# Patient Record
Sex: Male | Born: 1995 | Race: White | Hispanic: Yes | Marital: Single
Health system: Southern US, Community
[De-identification: ages and names within clinical notes are randomized; demographics above are authoritative.]

## PROBLEM LIST (undated history)

## (undated) HISTORY — PX: MULTIPLE TOOTH EXTRACTIONS: SHX2053

---
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
## 2015-05-08 ENCOUNTER — Encounter (HOSPITAL_COMMUNITY): Payer: Self-pay | Admitting: Emergency Medicine

## 2015-05-08 ENCOUNTER — Ambulatory Visit (HOSPITAL_COMMUNITY)
Admission: EM | Admit: 2015-05-08 | Discharge: 2015-05-08 | Disposition: A | Discharge status: Home / Self Care | Payer: Medicaid Other | Attending: Emergency Medicine | Admitting: Emergency Medicine

## 2015-05-08 ENCOUNTER — Ambulatory Visit (INDEPENDENT_AMBULATORY_CARE_PROVIDER_SITE_OTHER): Payer: Medicaid Other

## 2015-05-08 DIAGNOSIS — M549 Dorsalgia, unspecified: Secondary | ICD-10-CM

## 2015-05-08 DIAGNOSIS — R0789 Other chest pain: Secondary | ICD-10-CM

## 2015-05-08 MED ORDER — MELOXICAM 15 MG PO TABS: 15.0000 mg | ORAL_TABLET | Freq: Every day | ORAL | Status: DC

## 2015-05-08 MED ORDER — CYCLOBENZAPRINE HCL 10 MG PO TABS: 10.0000 mg | ORAL_TABLET | Freq: Three times a day (TID) | ORAL | Status: DC | PRN

## 2015-05-08 MED ORDER — PREDNISONE 50 MG PO TABS: ORAL_TABLET | ORAL | Status: DC

## 2015-05-08 NOTE — ED Notes (Signed)
[facility] with a complaint of back pain that has been ongoing for 1 month. The patient stated that over the last 3 days it has gotten worse and made it where he can not sleep and has trouble getting out of the bed.

## 2015-05-08 NOTE — Discharge Instructions (Signed)
X-ray is normal. Your pain is likely coming from the muscles in your back. Take prednisone daily for 5 days. Take meloxicam daily for 1 week, then as needed. Use the Flexeril 3 times a day as needed for muscle spasms. This medicine will make you drowsy. Follow-up with your regular doctor in 1-2 weeks.

## 2015-05-08 NOTE — ED Provider Notes (Signed)
CSN: [id number]     Arrival date & time 05/08/15  1555 History   First MD Initiated Contact with Patient 05/08/15 1752     No chief complaint on file.  (Consider location/radiation/quality/duration/timing/severity/associated sxs/prior Treatment) HPI He is a 20 year old man here for evaluation of back pain. He states this is been going on for about a month, but has worsened in the last 2-3 days. He states it is in the middle of his back. He does report some spasm sensations.  He denies anything specific that triggers the pain. He does work in [employer] and took 2 weeks off, but states it has not helped. He denies any radiating pain. He has tried ibuprofen without improvement. In the last several days he has also had pain across the anterior chest wall.  History reviewed. No pertinent past medical history. Past Surgical History  Procedure Laterality Date  . Multiple tooth extractions      premolars   History reviewed. No pertinent family history. Social History  Substance Use Topics  . Smoking status: Never Smoker   . Smokeless tobacco: None  . Alcohol Use: No    Review of Systems As in history of present illness Allergies  Review of patient's allergies indicates no known allergies.  Home Medications   Prior to Admission medications   Medication Sig Start Date End Date Taking? Authorizing Provider  ibuprofen (ADVIL,MOTRIN) 200 MG tablet Take 200 mg by mouth every 6 (six) hours as needed. For pain   Yes Historical Provider, MD  cetirizine (ZYRTEC) 10 MG tablet Take 10 mg by mouth daily.    Historical Provider, MD  cyclobenzaprine (FLEXERIL) 10 MG tablet Take 1 tablet (10 mg total) by mouth 3 (three) times daily as needed for muscle spasms. 05/08/15   Charm RingsErin J Honig, MD  fluticasone (FLONASE) 50 MCG/ACT nasal spray Place 1 spray into both nostrils 2 (two) times daily. 05/16/14   Linna HoffJames D Kindl, MD  meloxicam (MOBIC) 15 MG tablet Take 1 tablet (15 mg total) by mouth daily. For 1 week,  then as needed 05/08/15   Charm RingsErin J Honig, MD  predniSONE (DELTASONE) 50 MG tablet Take 1 pill daily for 5 days. 05/08/15   Charm RingsErin J Honig, MD   Meds Ordered and Administered this Visit  Medications - No data to display  BP 133/81 mmHg  Pulse 60  Temp(Src) 97.5 F (36.4 C) (Oral)  Resp 16  SpO2 100% No data found.   Physical Exam  Constitutional: He is oriented to person, place, and time. He appears well-nourished. No distress.  Cardiovascular: Normal rate.   Pulmonary/Chest: Effort normal. He exhibits tenderness (across anterior chest wall).  Musculoskeletal:  Back: No erythema or edema. No vertebral tenderness or step-offs. No point tenderness. He does have moderate spasm in the left thoracic paraspinous muscles. 5 out of 5 strength in bilateral upper extremities.  Neurological: He is alert and oriented to person, place, and time.    ED Course  Procedures (including critical care time)  Labs Review Labs Reviewed - No data to display  Imaging Review Dg Thoracic Spine 2 View  05/08/2015  CLINICAL DATA:  Upper back pain for 1 month.  No known injury. EXAM: THORACIC SPINE 2 VIEWS COMPARISON:  Chest x-ray dated 10/05/2014 FINDINGS: There is no evidence of thoracic spine fracture. Alignment is normal. No other significant bone abnormalities are identified. IMPRESSION: Negative. Electronically Signed   By: Francene BoyersJames  Maxwell M.D.   On: 05/08/2015 18:49     MDM  1. Mid back pain   2. Chest wall pain    X-ray negative. Suspect musculoskeletal pain. Treat with prednisone, Flexeril, meloxicam. Follow up with PCP in 1-2 weeks.    Charm Rings, MD 05/08/15 [phone]

## 2015-08-31 ENCOUNTER — Ambulatory Visit (HOSPITAL_COMMUNITY)
Admission: EM | Admit: 2015-08-31 | Discharge: 2015-08-31 | Disposition: A | Discharge status: Home / Self Care | Payer: Medicaid Other | Attending: Emergency Medicine | Admitting: Emergency Medicine

## 2015-08-31 ENCOUNTER — Encounter (HOSPITAL_COMMUNITY): Payer: Self-pay | Admitting: Emergency Medicine

## 2015-08-31 DIAGNOSIS — S61219A Laceration without foreign body of unspecified finger without damage to nail, initial encounter: Secondary | ICD-10-CM | POA: Diagnosis not present

## 2015-08-31 MED ORDER — LIDOCAINE HCL (PF) 1 % IJ SOLN
INTRAMUSCULAR | Status: AC
  Filled 2015-08-31: qty 30

## 2015-08-31 MED ORDER — NAPROXEN 500 MG PO TABS: 500.0000 mg | ORAL_TABLET | Freq: Two times a day (BID) | ORAL | 0 refills | Status: DC

## 2015-08-31 NOTE — ED Provider Notes (Signed)
CSN: [id number]     Arrival date & time 08/31/15  1424 History   First MD Initiated Contact with Patient 08/31/15 1456     Chief Complaint  Patient presents with  . Extremity Laceration   (Consider location/radiation/quality/duration/timing/severity/associated sxs/prior Treatment) HPI  Patient reports that he was repairing sheet rock in his room around 11am and sustained a cut to his left pointer finger from a box cutter.  He reports limited ROM secondary to pain and swelling.  He denies numbness or tingling.  He reports that he took Motrin 200mg  for pain and bandaged it.  Motrin has not helped pain much.  Last Tetanus 2015.  History reviewed. No pertinent past medical history. Past Surgical History:  Procedure Laterality Date  . MULTIPLE TOOTH EXTRACTIONS     premolars   No family history on file. Social History  Substance Use Topics  . Smoking status: Never Smoker  . Smokeless tobacco: Never Used  . Alcohol use No    Review of Systems  Skin: Positive for wound.  Neurological: Negative for weakness and numbness.    Allergies  Review of patient's allergies indicates no known allergies.  Home Medications   Prior to Admission medications   Medication Sig Start Date End Date Taking? Authorizing Provider  cetirizine (ZYRTEC) 10 MG tablet Take 10 mg by mouth daily.    Historical Provider, MD  cyclobenzaprine (FLEXERIL) 10 MG tablet Take 1 tablet (10 mg total) by mouth 3 (three) times daily as needed for muscle spasms. 05/08/15   Charm Rings, MD  fluticasone (FLONASE) 50 MCG/ACT nasal spray Place 1 spray into both nostrils 2 (two) times daily. 05/16/14   [name], MD  naproxen (NAPROSYN) 500 MG tablet Take 1 tablet (500 mg total) by mouth 2 (two) times daily with a meal. As needed for pain and swelling. 08/31/15   [name], DO   Meds Ordered and Administered this Visit  Medications - No data to display  BP 133/62 (BP Location: Left Arm)   Pulse 64   Temp 98.1 F  (36.7 C) (Oral)   Resp 16   SpO2 100%  No data found.   Physical Exam  Constitutional: He appears well-developed. No distress.  Musculoskeletal:       Hands: Dorsal aspect of left index finger superficial to the PIP with ~ 1.5cm laceration, actively bleeding.  No tendon appreciated on visual exam.  Left thumb with a superficial laceration that is hemostatic. +full extension of fingers, limited flexion of left index finger secondary to pain and swelling.  Able to flex about 25 degrees.    Neurological:  Light touch sensation to all fingers present.  Pressure sensation and pain sensation also in tact.    Urgent Care Course   Clinical Course    .Marland KitchenLaceration Repair Date/Time: 08/31/2015 4:03 PM Performed by: [name] Authorized by: Charm Rings   Consent:    Consent obtained:  Verbal   Consent given by:  Patient   Risks discussed:  Infection, pain, tendon damage, nerve damage, need for additional repair and vascular damage   Alternatives discussed:  No treatment and referral Anesthesia (see MAR for exact dosages):    Anesthesia method:  Nerve block   Block location:  Metacarpal block of the second digit   Block needle gauge:  25 G   Block anesthetic:  Lidocaine 1% w/o epi   Block injection procedure:  Anatomic landmarks palpated, negative aspiration for blood and introduced needle  Block outcome:  Anesthesia achieved Laceration details:    Location:  Finger   Finger location:  L index finger Repair type:    Repair type:  Simple Pre-procedure details:    Preparation:  Patient was prepped and draped in usual sterile fashion Exploration:    Hemostasis achieved with:  Direct pressure   Wound exploration: wound explored through full range of motion   Treatment:    Area cleansed with:  Betadine and soap and water   Amount of cleaning:  Standard   Irrigation method:  Pressure wash   Visualized foreign bodies/material removed: no   Skin repair:    Repair method:   Sutures   Suture size:  4-0   Suture material:  Prolene   Suture technique:  Simple interrupted   Number of sutures:  3 Approximation:    Approximation:  Close   Vermilion border: well-aligned   Post-procedure details:    Patient tolerance of procedure:  Tolerated well, no immediate complications   (including critical care time)  Labs Review Labs Reviewed - No data to display  Imaging Review No results found.   MDM   1. Laceration of finger, initial encounter     Keith Brooks is a 20 y.o. male that presents to [facility] for laceration of left index finger.  Laceration was explored and irrigated.  No tendon involvement apparent.  Patient able to fully extend finger.  Laceration repaired with 3 simple interrupted sutures.  Naproxen rx'd.  Discussed return precautions.  Patient to follow up in 1 week for suture removal.  Meds ordered this encounter  Medications  . naproxen (NAPROSYN) 500 MG tablet    Sig: Take 1 tablet (500 mg total) by mouth 2 (two) times daily with a meal. As needed for pain and swelling.    Dispense:  14 tablet    Refill:  0     [name], DO PGY-3, [facility]        [name], DO 08/31/15 1621    [name], DO 08/31/15 1621

## 2015-08-31 NOTE — ED Triage Notes (Signed)
PT cut his left thumb and index finger with a blade at 11am. No active bleeding.

## 2016-03-07 ENCOUNTER — Ambulatory Visit (HOSPITAL_COMMUNITY)
Admission: EM | Admit: 2016-03-07 | Discharge: 2016-03-07 | Disposition: A | Discharge status: Home / Self Care | Payer: Medicaid Other | Attending: Emergency Medicine | Admitting: Emergency Medicine

## 2016-03-07 ENCOUNTER — Encounter (HOSPITAL_COMMUNITY): Payer: Self-pay | Admitting: Emergency Medicine

## 2016-03-07 DIAGNOSIS — H1030 Unspecified acute conjunctivitis, unspecified eye: Secondary | ICD-10-CM

## 2016-03-07 MED ORDER — POLYMYXIN B-TRIMETHOPRIM 10000-0.1 UNIT/ML-% OP SOLN: 2.0000 [drp] | OPHTHALMIC | 0 refills | Status: DC

## 2016-03-07 NOTE — ED Provider Notes (Signed)
CSN: [id number]     Arrival date & time 03/07/16  1555 History   First MD Initiated Contact with Patient 03/07/16 1654     Chief Complaint  Patient presents with  . Conjunctivitis   (Consider location/radiation/quality/duration/timing/severity/associated sxs/prior Treatment) Patient c/o redness and discharge bilateral eyes.   The history is provided by the patient.  Conjunctivitis  This is a new problem. The current episode started 6 to 12 hours ago. The problem occurs constantly. The problem has not changed since onset.Nothing aggravates the symptoms. Nothing relieves the symptoms. He has tried nothing for the symptoms.    History reviewed. No pertinent past medical history. Past Surgical History:  Procedure Laterality Date  . MULTIPLE TOOTH EXTRACTIONS     premolars   History reviewed. No pertinent family history. Social History  Substance Use Topics  . Smoking status: Current Every Day Smoker    Packs/day: 0.10  . Smokeless tobacco: Never Used  . Alcohol use No    Review of Systems  Constitutional: Negative.   HENT: Negative.   Eyes: Positive for redness.  Respiratory: Negative.   Cardiovascular: Negative.   Gastrointestinal: Negative.   Endocrine: Negative.   Genitourinary: Negative.   Musculoskeletal: Negative.   Allergic/Immunologic: Negative.   Neurological: Negative.   Hematological: Negative.   Psychiatric/Behavioral: Negative.     Allergies  Patient has no known allergies.  Home Medications   Prior to Admission medications   Medication Sig Start Date End Date Taking? Authorizing Provider  cetirizine (ZYRTEC) 10 MG tablet Take 10 mg by mouth daily.    Historical Provider, MD  cyclobenzaprine (FLEXERIL) 10 MG tablet Take 1 tablet (10 mg total) by mouth 3 (three) times daily as needed for muscle spasms. 05/08/15   Charm RingsErin J Honig, MD  fluticasone (FLONASE) 50 MCG/ACT nasal spray Place 1 spray into both nostrils 2 (two) times daily. 05/16/14   Linna HoffJames D Kindl, MD   naproxen (NAPROSYN) 500 MG tablet Take 1 tablet (500 mg total) by mouth 2 (two) times daily with a meal. As needed for pain and swelling. 08/31/15   Raliegh IpAshly M Gottschalk, DO  trimethoprim-polymyxin b (POLYTRIM) ophthalmic solution Place 2 drops into both eyes every 4 (four) hours. 03/07/16   Deatra CanterWilliam J Dakayla Disanti, FNP   Meds Ordered and Administered this Visit  Medications - No data to display  BP 117/70 (BP Location: Right Arm)   Pulse 61   Temp 98.2 F (36.8 C) (Oral)   SpO2 100%  No data found.   Physical Exam  Constitutional: He appears well-developed and well-nourished.  HENT:  Head: Normocephalic and atraumatic.  Right Ear: External ear normal.  Left Ear: External ear normal.  Mouth/Throat: Oropharynx is clear and moist.  Eyes: EOM are normal. Pupils are equal, round, and reactive to light.  Bilateral conjunctivitis with erythema and edema conjunctiva and sclera erythema.  Neck: Normal range of motion. Neck supple.  Cardiovascular: Normal rate, regular rhythm and normal heart sounds.   Pulmonary/Chest: Effort normal and breath sounds normal.  Nursing note and vitals reviewed.   Urgent Care Course     Procedures (including critical care time)  Labs Review Labs Reviewed - No data to display  Imaging Review No results found.   Visual Acuity Review  Right Eye Distance:   Left Eye Distance:   Bilateral Distance:    Right Eye Near:   Left Eye Near:    Bilateral Near:         MDM   1. Acute conjunctivitis, unspecified  acute conjunctivitis type, unspecified laterality    Polytrim eye gtt's      Deatra Canter, FNP 03/07/16 1730

## 2016-03-07 NOTE — ED Triage Notes (Signed)
Pt woke up this morning with both eyes stuck shut.  Pt reports some nasal congestion and a slight headache as well.

## 2017-07-04 ENCOUNTER — Ambulatory Visit (HOSPITAL_COMMUNITY)
Admission: EM | Admit: 2017-07-04 | Discharge: 2017-07-04 | Disposition: A | Discharge status: Home / Self Care | Payer: Self-pay | Attending: Family Medicine | Admitting: Family Medicine

## 2017-07-04 ENCOUNTER — Encounter (HOSPITAL_COMMUNITY): Payer: Self-pay | Admitting: Family Medicine

## 2017-07-04 ENCOUNTER — Ambulatory Visit (INDEPENDENT_AMBULATORY_CARE_PROVIDER_SITE_OTHER): Payer: Self-pay

## 2017-07-04 DIAGNOSIS — W540XXD Bitten by dog, subsequent encounter: Secondary | ICD-10-CM

## 2017-07-04 DIAGNOSIS — S91352D Open bite, left foot, subsequent encounter: Secondary | ICD-10-CM

## 2017-07-04 DIAGNOSIS — S9032XA Contusion of left foot, initial encounter: Secondary | ICD-10-CM

## 2017-07-04 MED ORDER — NAPROXEN 500 MG PO TABS: 500.0000 mg | ORAL_TABLET | Freq: Two times a day (BID) | ORAL | 0 refills | Status: AC

## 2017-07-04 NOTE — ED Provider Notes (Signed)
[facility]   [id number] 07/04/17 Arrival Time: 1123  SUBJECTIVE: History from: patient. Keith GuttingJose G Brooks is a 22 y.o. male complains of worsening left foot pain that began 6 days ago.  States he was attacked by a pitbull.  Seen at "clinic" and prescribed ibuprofen and antibiotics, also received pain shot in office.  X-rays were not obtained.  States symptoms have not improved.  Localizes the pain to the left foot.  Describes the pain as constant and sharp, aching, and throbbing in character.  Has tried ibuprofen 800 mg without relief.  Symptoms are made worse with weight-bearing.  Denies similar symptoms in the past.  Complains of erythema, effusion, and ecchymosis.  Denies fever, chills, nausea, vomiting, weakness, numbness and tingling.      ROS: As per HPI.  Tetanus up-to-date  History reviewed. No pertinent past medical history. Past Surgical History:  Procedure Laterality Date  . MULTIPLE TOOTH EXTRACTIONS     premolars   No Known Allergies No current facility-administered medications on file prior to encounter.    Current Outpatient Medications on File Prior to Encounter  Medication Sig Dispense Refill  . cetirizine (ZYRTEC) 10 MG tablet Take 10 mg by mouth daily.     Social History   Socioeconomic History  . Marital status: Single    Spouse name: Not on file  . Number of children: Not on file  . Years of education: Not on file  . Highest education level: Not on file  Occupational History  . Not on file  Social Needs  . Financial resource strain: Not on file  . Food insecurity:    Worry: Not on file    Inability: Not on file  . Transportation needs:    Medical: Not on file    Non-medical: Not on file  Tobacco Use  . Smoking status: Current Every Day Smoker    Packs/day: 0.10  . Smokeless tobacco: Never Used  Substance and Sexual Activity  . Alcohol use: No  . Drug use: Yes    Frequency: 4.0 times per week    Types: Marijuana  . Sexual activity: Never    Lifestyle  . Physical activity:    Days per week: Not on file    Minutes per session: Not on file  . Stress: Not on file  Relationships  . Social connections:    Talks on phone: Not on file    Gets together: Not on file    Attends religious service: Not on file    Active member of club or organization: Not on file    Attends meetings of clubs or organizations: Not on file    Relationship status: Not on file  . Intimate partner violence:    Fear of current or ex partner: Not on file    Emotionally abused: Not on file    Physically abused: Not on file    Forced sexual activity: Not on file  Other Topics Concern  . Not on file  Social History Narrative  . Not on file   History reviewed. No pertinent family history.  OBJECTIVE:  Vitals:   07/04/17 1136  BP: 123/81  Pulse: 82  Resp: 18  Temp: (!) 97.5 F (36.4 C)  SpO2: 98%    General appearance: AOx3; in no acute distress.  Head: NCAT Lungs: CTA bilaterally Heart: RRR.  Clear S1 and S2 without murmur, gallops, or rubs.  Radial pulses 2+ bilaterally. Musculoskeletal: Left foot Inspection: Three healing bite wounds on lateral and  medial aspect of foot  with mild surrounding erythema.  Mild ecchymosis dorsal aspect of 2nd  and 3rd toes.  No active drainage Palpation: Diffusely tender to palpation about the foot, specifically over  medial and anterior foot ROM: LROM due to discomfort; patient wiggles toes CV: Dorsalis pedis and posterior tibialis pulse 2+ and intact; cap refill <2  secs Skin: warm and dry Neurologic: antalgic gait; Sensation intact about the lower extremities Psychological: alert and cooperative; normal mood and affect        DIAGNOSTIC STUDIES: CLINICAL DATA: Dog bite 1 week prior  EXAM: LEFT FOOT - COMPLETE 3+ VIEW  COMPARISON: None.  FINDINGS: Soft tissue swelling in the left mid foot. No fracture or dislocation. No cortical erosions or periosteal reaction. No appreciable soft tissue  gas. No significant arthropathy. No suspicious focal osseous lesions. No radiopaque foreign body.  IMPRESSION: Soft tissue swelling in the left mid foot. No fracture, malalignment or specific radiographic findings of osteomyelitis.   Electronically Signed By: Delbert Phenix M.D. On: 07/04/2017 12:13  X-rays negative for bony abnormalities including fracture, or dislocation.   I have reviewed the x-rays and the radiologist interpretation. I am in agreement with the radiologist interpretation.    ASSESSMENT & PLAN:  1. Dog bite of left foot, subsequent encounter   2. Contusion of left foot, initial encounter     Meds ordered this encounter  Medications  . naproxen (NAPROSYN) 500 MG tablet    Sig: Take 1 tablet (500 mg total) by mouth 2 (two) times daily.    Dispense:  30 tablet    Refill:  0    Order Specific Question:   Supervising Provider    Answer:   Isa Rankin [[id number]]   X-rays were negative for fracture, retained foreign body, or signs of bone infection Continue conservative management of rest, ice, and elevate Continue to take antibiotic daily and as prescribed to completion Discontinue ibuprofen 800 mg.  Take naproxen as needed for pain relief (may cause abdominal discomfort, ulcers, and GI bleeds avoid taking with other NSAIDs) Follow up with PCP if next week for reevaluation and managment Return or go to the ER if you have any new or worsening symptoms (fever, chills, chest pain, abdominal pain, changes in bowel or bladder habits, pain radiating into lower legs, etc...)   Patient declines crutches and blood work due to self-pay status.  Reviewed expectations re: course of current medical issues. Questions answered. Outlined signs and symptoms indicating need for more acute intervention. Patient verbalized understanding. After Visit Summary given.    Rennis Harding, PA-C 07/04/17 1245

## 2017-07-04 NOTE — Discharge Instructions (Signed)
X-rays were negative for fracture, retained foreign body, or signs of bone infection Continue conservative management of rest, ice, and elevate Continue to take antibiotic daily and as prescribed to completion Discontinue ibuprofen 800 mg.  Take naproxen as needed for pain relief (may cause abdominal discomfort, ulcers, and GI bleeds avoid taking with other NSAIDs) Follow up with PCP if next week for reevaluation and managment Return or go to the ER if you have any new or worsening symptoms (fever, chills, chest pain, abdominal pain, changes in bowel or bladder habits, pain radiating into lower legs, etc...)

## 2017-07-04 NOTE — ED Triage Notes (Signed)
Pt here for dog bite to the left foot. He was seen a week ago when this happened. He was prescribed ibuprofen and abx. sts the pain is worse.

## 2017-07-30 ENCOUNTER — Ambulatory Visit (HOSPITAL_COMMUNITY)
Admission: EM | Admit: 2017-07-30 | Discharge: 2017-07-30 | Disposition: A | Discharge status: Home / Self Care | Payer: Self-pay | Attending: Family Medicine | Admitting: Family Medicine

## 2017-07-30 ENCOUNTER — Encounter (HOSPITAL_COMMUNITY): Payer: Self-pay | Admitting: Emergency Medicine

## 2017-07-30 DIAGNOSIS — L0291 Cutaneous abscess, unspecified: Secondary | ICD-10-CM

## 2017-07-30 MED ORDER — DOXYCYCLINE HYCLATE 100 MG PO CAPS: 100.0000 mg | ORAL_CAPSULE | Freq: Two times a day (BID) | ORAL | 0 refills | Status: AC

## 2017-07-30 NOTE — ED Provider Notes (Signed)
[facility]   [id number] 07/30/17 Arrival Time: 1024   SUBJECTIVE:  Rosanne GuttingJose G Allman is a 22 y.o. male who presents with a possible abscess of his right buttocks. Began approximately 6 months ago, but has gotten worse.  Has tried "popping" without improvement in his symptoms.  Denies aggravating or alleviating symptoms.  No past hx of similar symptoms.  Complains of associated erythema and swelling.  Denies fever, chills, nausea, vomiting, or changes in bowel or bladder habits.    ROS: As per HPI.  History reviewed. No pertinent past medical history. Past Surgical History:  Procedure Laterality Date  . MULTIPLE TOOTH EXTRACTIONS     premolars   No Known Allergies No current facility-administered medications on file prior to encounter.    Current Outpatient Medications on File Prior to Encounter  Medication Sig Dispense Refill  . cetirizine (ZYRTEC) 10 MG tablet Take 10 mg by mouth daily.    . naproxen (NAPROSYN) 500 MG tablet Take 1 tablet (500 mg total) by mouth 2 (two) times daily. 30 tablet 0   Social History   Socioeconomic History  . Marital status: Single    Spouse name: Not on file  . Number of children: Not on file  . Years of education: Not on file  . Highest education level: Not on file  Occupational History  . Not on file  Social Needs  . Financial resource strain: Not on file  . Food insecurity:    Worry: Not on file    Inability: Not on file  . Transportation needs:    Medical: Not on file    Non-medical: Not on file  Tobacco Use  . Smoking status: Current Every Day Smoker    Packs/day: 0.10  . Smokeless tobacco: Never Used  Substance and Sexual Activity  . Alcohol use: No  . Drug use: Yes    Frequency: 4.0 times per week    Types: Marijuana  . Sexual activity: Never  Lifestyle  . Physical activity:    Days per week: Not on file    Minutes per session: Not on file  . Stress: Not on file  Relationships  . Social connections:    Talks on  phone: Not on file    Gets together: Not on file    Attends religious service: Not on file    Active member of club or organization: Not on file    Attends meetings of clubs or organizations: Not on file    Relationship status: Not on file  . Intimate partner violence:    Fear of current or ex partner: Not on file    Emotionally abused: Not on file    Physically abused: Not on file    Forced sexual activity: Not on file  Other Topics Concern  . Not on file  Social History Narrative  . Not on file   History reviewed. No pertinent family history.  OBJECTIVE:  Vitals:   07/30/17 1101  BP: 119/76  Pulse: (!) 56  Resp: 16  Temp: 97.7 F (36.5 C)  TempSrc: Oral  SpO2: 100%     General appearance: alert; no distress Lungs: CTA bilaterally CV: RRR without gallop, rub, or murmur; bilateral 2+ radial pulses Skin: 4 x 2 cm area of induration and erythema of his right buttocks without area of fluctuance; tender to touch; no active drainage Psychological: alert and cooperative; normal mood and affect  ASSESSMENT & PLAN:  1. Abscess     Meds ordered this encounter  Medications  . doxycycline (VIBRAMYCIN) 100 MG capsule    Sig: Take 1 capsule (100 mg total) by mouth 2 (two) times daily.    Dispense:  20 capsule    Refill:  0    Order Specific Question:   Supervising Provider    Answer:   Isa Rankin [[id number]]   Apply warm compresses 3-4x daily for 10-15 minutes Wash site daily with warm water and mild soap Keep covered to avoid friction Take antibiotic as prescribed and to completion Follow up here or with PCP if symptoms persists Return or go to the ED if you have any new or worsening symptoms  Reviewed expectations re: course of current medical issues. Questions answered. Outlined signs and symptoms indicating need for more acute intervention. Patient verbalized understanding. After Visit Summary given.          Rennis Harding, PA-C 07/30/17 1151

## 2017-07-30 NOTE — ED Triage Notes (Signed)
Pt sts possible abscess to right buttocks x months getting larger per pt

## 2020-10-02 ENCOUNTER — Emergency Department (HOSPITAL_COMMUNITY): Payer: Self-pay

## 2020-10-02 ENCOUNTER — Emergency Department (HOSPITAL_COMMUNITY)
Admission: EM | Admit: 2020-10-02 | Discharge: 2020-10-02 | Discharge status: Against Medical Advice | Payer: Self-pay | Attending: Emergency Medicine | Admitting: Emergency Medicine

## 2020-10-02 ENCOUNTER — Encounter (HOSPITAL_COMMUNITY): Payer: Self-pay | Admitting: Emergency Medicine

## 2020-10-02 DIAGNOSIS — Z20822 Contact with and (suspected) exposure to covid-19: Secondary | ICD-10-CM | POA: Insufficient documentation

## 2020-10-02 DIAGNOSIS — S61401A Unspecified open wound of right hand, initial encounter: Secondary | ICD-10-CM | POA: Insufficient documentation

## 2020-10-02 DIAGNOSIS — W540XXA Bitten by dog, initial encounter: Secondary | ICD-10-CM | POA: Insufficient documentation

## 2020-10-02 DIAGNOSIS — R4182 Altered mental status, unspecified: Secondary | ICD-10-CM | POA: Insufficient documentation

## 2020-10-02 DIAGNOSIS — Z23 Encounter for immunization: Secondary | ICD-10-CM | POA: Insufficient documentation

## 2020-10-02 DIAGNOSIS — R000 Tachycardia, unspecified: Secondary | ICD-10-CM | POA: Insufficient documentation

## 2020-10-02 DIAGNOSIS — Y900 Blood alcohol level of less than 20 mg/100 ml: Secondary | ICD-10-CM | POA: Insufficient documentation

## 2020-10-02 DIAGNOSIS — R609 Edema, unspecified: Secondary | ICD-10-CM

## 2020-10-02 DIAGNOSIS — T148XXA Other injury of unspecified body region, initial encounter: Secondary | ICD-10-CM

## 2020-10-02 LAB — URINALYSIS, ROUTINE W REFLEX MICROSCOPIC
Bilirubin Urine: NEGATIVE
Glucose, UA: NEGATIVE mg/dL
Ketones, ur: 20 mg/dL — AB
Leukocytes,Ua: NEGATIVE
Nitrite: NEGATIVE
Protein, ur: NEGATIVE mg/dL
Specific Gravity, Urine: 1.024 (ref 1.005–1.030)
pH: 5 (ref 5.0–8.0)

## 2020-10-02 LAB — CBC WITH DIFFERENTIAL/PLATELET
Abs Immature Granulocytes: 0.19 10*3/uL — ABNORMAL HIGH (ref 0.00–0.07)
Basophils Absolute: 0.1 10*3/uL (ref 0.0–0.1)
Basophils Relative: 0 %
Eosinophils Absolute: 0 10*3/uL (ref 0.0–0.5)
Eosinophils Relative: 0 %
HCT: 39.1 % (ref 39.0–52.0)
Hemoglobin: 13.1 g/dL (ref 13.0–17.0)
Immature Granulocytes: 1 %
Lymphocytes Relative: 5 %
Lymphs Abs: 1.2 10*3/uL (ref 0.7–4.0)
MCH: 29.2 pg (ref 26.0–34.0)
MCHC: 33.5 g/dL (ref 30.0–36.0)
MCV: 87.1 fL (ref 80.0–100.0)
Monocytes Absolute: 2 10*3/uL — ABNORMAL HIGH (ref 0.1–1.0)
Monocytes Relative: 8 %
Neutro Abs: 21.8 10*3/uL — ABNORMAL HIGH (ref 1.7–7.7)
Neutrophils Relative %: 86 %
Platelets: 242 10*3/uL (ref 150–400)
RBC: 4.49 MIL/uL (ref 4.22–5.81)
RDW: 14 % (ref 11.5–15.5)
WBC: 25.2 10*3/uL — ABNORMAL HIGH (ref 4.0–10.5)
nRBC: 0 % (ref 0.0–0.2)

## 2020-10-02 LAB — URINALYSIS, COMPLETE (UACMP) WITH MICROSCOPIC
Bilirubin Urine: NEGATIVE
Glucose, UA: NEGATIVE mg/dL
Ketones, ur: 20 mg/dL — AB
Leukocytes,Ua: NEGATIVE
Nitrite: NEGATIVE
Protein, ur: NEGATIVE mg/dL
Specific Gravity, Urine: 1.024 (ref 1.005–1.030)
pH: 5 (ref 5.0–8.0)

## 2020-10-02 LAB — RAPID URINE DRUG SCREEN, HOSP PERFORMED
Amphetamines: POSITIVE — AB
Barbiturates: NOT DETECTED
Benzodiazepines: POSITIVE — AB
Cocaine: NOT DETECTED
Opiates: NOT DETECTED
Tetrahydrocannabinol: NOT DETECTED

## 2020-10-02 LAB — COMPREHENSIVE METABOLIC PANEL
ALT: 19 U/L (ref 0–44)
AST: 40 U/L (ref 15–41)
Albumin: 4.3 g/dL (ref 3.5–5.0)
Alkaline Phosphatase: 63 U/L (ref 38–126)
Anion gap: 15 (ref 5–15)
BUN: 18 mg/dL (ref 6–20)
CO2: 21 mmol/L — ABNORMAL LOW (ref 22–32)
Calcium: 9.2 mg/dL (ref 8.9–10.3)
Chloride: 103 mmol/L (ref 98–111)
Creatinine, Ser: 1.57 mg/dL — ABNORMAL HIGH (ref 0.61–1.24)
GFR, Estimated: >60 mL/min (ref >60)
Glucose, Bld: 60 mg/dL — ABNORMAL LOW (ref 70–99)
Potassium: 3.8 mmol/L (ref 3.5–5.1)
Sodium: 139 mmol/L (ref 135–145)
Total Bilirubin: 1.3 mg/dL — ABNORMAL HIGH (ref 0.3–1.2)
Total Protein: 6.9 g/dL (ref 6.5–8.1)

## 2020-10-02 LAB — CBG MONITORING, ED
Glucose-Capillary: 76 mg/dL (ref 70–99)
Glucose-Capillary: 85 mg/dL (ref 70–99)
Glucose-Capillary: 117 mg/dL — ABNORMAL HIGH (ref 70–99)

## 2020-10-02 LAB — I-STAT CHEM 8, ED
BUN: 21 mg/dL — ABNORMAL HIGH (ref 6–20)
Calcium, Ion: 1.06 mmol/L — ABNORMAL LOW (ref 1.15–1.40)
Chloride: 108 mmol/L (ref 98–111)
Creatinine, Ser: 1.5 mg/dL — ABNORMAL HIGH (ref 0.61–1.24)
Glucose, Bld: 59 mg/dL — ABNORMAL LOW (ref 70–99)
HCT: 39 % (ref 39.0–52.0)
Hemoglobin: 13.3 g/dL (ref 13.0–17.0)
Potassium: 3.7 mmol/L (ref 3.5–5.1)
Sodium: 139 mmol/L (ref 135–145)
TCO2: 24 mmol/L (ref 22–32)

## 2020-10-02 LAB — CK: Total CK: 915 U/L — ABNORMAL HIGH (ref 49–397)

## 2020-10-02 LAB — RESP PANEL BY RT-PCR (FLU A&B, COVID) ARPGX2
Influenza A by PCR: NEGATIVE
Influenza B by PCR: NEGATIVE
SARS Coronavirus 2 by RT PCR: NEGATIVE

## 2020-10-02 LAB — ACETAMINOPHEN LEVEL: Acetaminophen (Tylenol), Serum: <10 ug/mL — ABNORMAL LOW (ref 10–30)

## 2020-10-02 LAB — SALICYLATE LEVEL: Salicylate Lvl: <7 mg/dL — ABNORMAL LOW (ref 7.0–30.0)

## 2020-10-02 LAB — LACTIC ACID, PLASMA: Lactic Acid, Venous: 1.3 mmol/L (ref 0.5–1.9)

## 2020-10-02 LAB — ETHANOL: Alcohol, Ethyl (B): <10 mg/dL (ref <10)

## 2020-10-02 MED ORDER — SODIUM CHLORIDE 0.9 % IV BOLUS
1000.0000 mL | Freq: Once | INTRAVENOUS | Status: AC
  Administered 2020-10-02: 1000 mL via INTRAVENOUS

## 2020-10-02 MED ORDER — ONDANSETRON HCL 4 MG/2ML IJ SOLN
4.0000 mg | Freq: Once | INTRAMUSCULAR | Status: AC
  Administered 2020-10-02: 4 mg via INTRAVENOUS

## 2020-10-02 MED ORDER — AMOXICILLIN-POT CLAVULANATE 875-125 MG PO TABS: 1.0000 | ORAL_TABLET | Freq: Two times a day (BID) | ORAL | 0 refills | Status: AC

## 2020-10-02 MED ORDER — DEXTROSE-NACL 5-0.9 % IV SOLN: Freq: Once | INTRAVENOUS | Status: AC

## 2020-10-02 MED ORDER — MUPIROCIN CALCIUM 2 % EX CREA: 1.0000 "application " | TOPICAL_CREAM | Freq: Two times a day (BID) | CUTANEOUS | 0 refills | Status: AC

## 2020-10-02 MED ORDER — SODIUM CHLORIDE 0.9 % IV BOLUS
500.0000 mL | Freq: Once | INTRAVENOUS | Status: AC
  Administered 2020-10-02: 500 mL via INTRAVENOUS

## 2020-10-02 MED ORDER — IOHEXOL 350 MG/ML SOLN
60.0000 mL | Freq: Once | INTRAVENOUS | Status: AC | PRN
  Administered 2020-10-02: 60 mL via INTRAVENOUS

## 2020-10-02 MED ORDER — TETANUS-DIPHTH-ACELL PERTUSSIS 5-2.5-18.5 LF-MCG/0.5 IM SUSY
0.5000 mL | PREFILLED_SYRINGE | Freq: Once | INTRAMUSCULAR | Status: AC
  Administered 2020-10-02: 0.5 mL via INTRAMUSCULAR
  Filled 2020-10-02: qty 0.5

## 2020-10-02 MED ORDER — AMOXICILLIN-POT CLAVULANATE 875-125 MG PO TABS: 1.0000 | ORAL_TABLET | Freq: Two times a day (BID) | ORAL | 0 refills | Status: DC

## 2020-10-02 MED ORDER — SODIUM CHLORIDE 0.9 % IV SOLN
3.0000 g | Freq: Once | INTRAVENOUS | Status: AC
  Administered 2020-10-02: 3 g via INTRAVENOUS
  Filled 2020-10-02: qty 8

## 2020-10-02 NOTE — Discharge Instructions (Addendum)
I am very concerned that you have undiagnosed medical problems causing severe blood test abnormalities.  Your blood sugar continues to drop.  Return back to the ER at anytime if you change your mind.  Drink and eat a lot of sugary foods at home, follow-up with your doctor in 1 or 2 days.   Regarding your hand wound: Do not submerge wounds in any body of water:  no pools, hot tubs, oceans, lakes, tubs, streams.  Follow up with hand surgery as an outpatient.  Take all antibiotics

## 2020-10-02 NOTE — ED Notes (Addendum)
Pt alert and oriented x 4 at this time. Pt requesting to leave and is refusing to stay. Dr. Audley Hose notified.

## 2020-10-02 NOTE — ED Notes (Signed)
Pt awake and significantly more oriented than on arrival. Hand wound unwrapped and placed in iodine and sterile water to soak.
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
## 2020-10-02 NOTE — ED Provider Notes (Addendum)
Patient signed out to me as pending recovery from suspected substance use or other mental status resulting from Versed and Haldol given.  On my examination patient is awake and alert has no complaints of any pain.  He states he has no recollection what happened.  Denies any history of drug use.  He has no meningismal signs no fevers.  Review of his studies here, concerning for persistent hypoglycemia requiring D5 drip, elevated WBC count unclear etiology, episodes of hypotension perhaps due to the first that he was given.  Lactic acid and blood cultures are sent.  Patient to be admitted to the hospitalist team for further observation.   Cheryll Cockayne, MD 10/02/20 [phone]   Addendum: Patient's mother has arrived to see the patient.  Patient continues to be awake and alert and appropriate he is calm and cooperative.  He is oriented to person time and place.  His symptoms have largely resolved although I am not sure what caused him.  His urine drug screen is returned positive for amphetamines although the patient denies any drug use.  Patient states he has 2 young children at home and needs to go home.  I explained to him I am concerned for persistent hypoglycemia explained to him in layman's terms that he may become confused altered combative again and may pass out and have serious injury possible death.  Patient expresses understanding but states he just cannot stay due to his children's needs.  Mother is at bedside unable to convince him and unable to provide additional assistance for his children as she has other obligations as well.  Patient has decision making capacity, will be leaving AGAINST MEDICAL ADVICE I advised him to return at any time.   Cheryll Cockayne, MD 10/02/20 [phone]

## 2020-10-02 NOTE — ED Notes (Signed)
Pt periodically awakening, sitting upright and slamming self side to side, pulling against restraints. Pt able to be momentarily verbally redirected as he quickly falls back unconscious. Seizure pads placed on rails to prevent injury to self.
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
## 2020-10-03 ENCOUNTER — Encounter (HOSPITAL_COMMUNITY): Payer: Self-pay | Admitting: Emergency Medicine

## 2020-10-07 LAB — CULTURE, BLOOD (ROUTINE X 2)
Culture: NO GROWTH
Culture: NO GROWTH

## 2021-07-06 ENCOUNTER — Ambulatory Visit (HOSPITAL_COMMUNITY)
Admission: EM | Admit: 2021-07-06 | Discharge: 2021-07-06 | Disposition: A | Discharge status: Home / Self Care | Payer: Self-pay
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
## 2021-07-30 ENCOUNTER — Telehealth (HOSPITAL_COMMUNITY): Payer: Self-pay

## 2021-07-30 NOTE — [facility] Assessment (Signed)
Care Management - [facility] Follow Up Discharges   Writer attempted to make contact with patient today and was unsuccessful.  Voicemail is not set up.  Per chart review, patient was referred to outpatint services on 2nd floor at [facility]
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
## 2022-01-16 IMAGING — DX DG HAND COMPLETE 3+V*R*
1 series · 3 of 3 positions shown · non-contrast
Comparison: None.

CLINICAL DATA: Laceration

EXAM:
RIGHT HAND - COMPLETE 3+ VIEW

[Series 1: hand · 0.14mm/px · 3 of 3 slices shown]
[im 1/3]
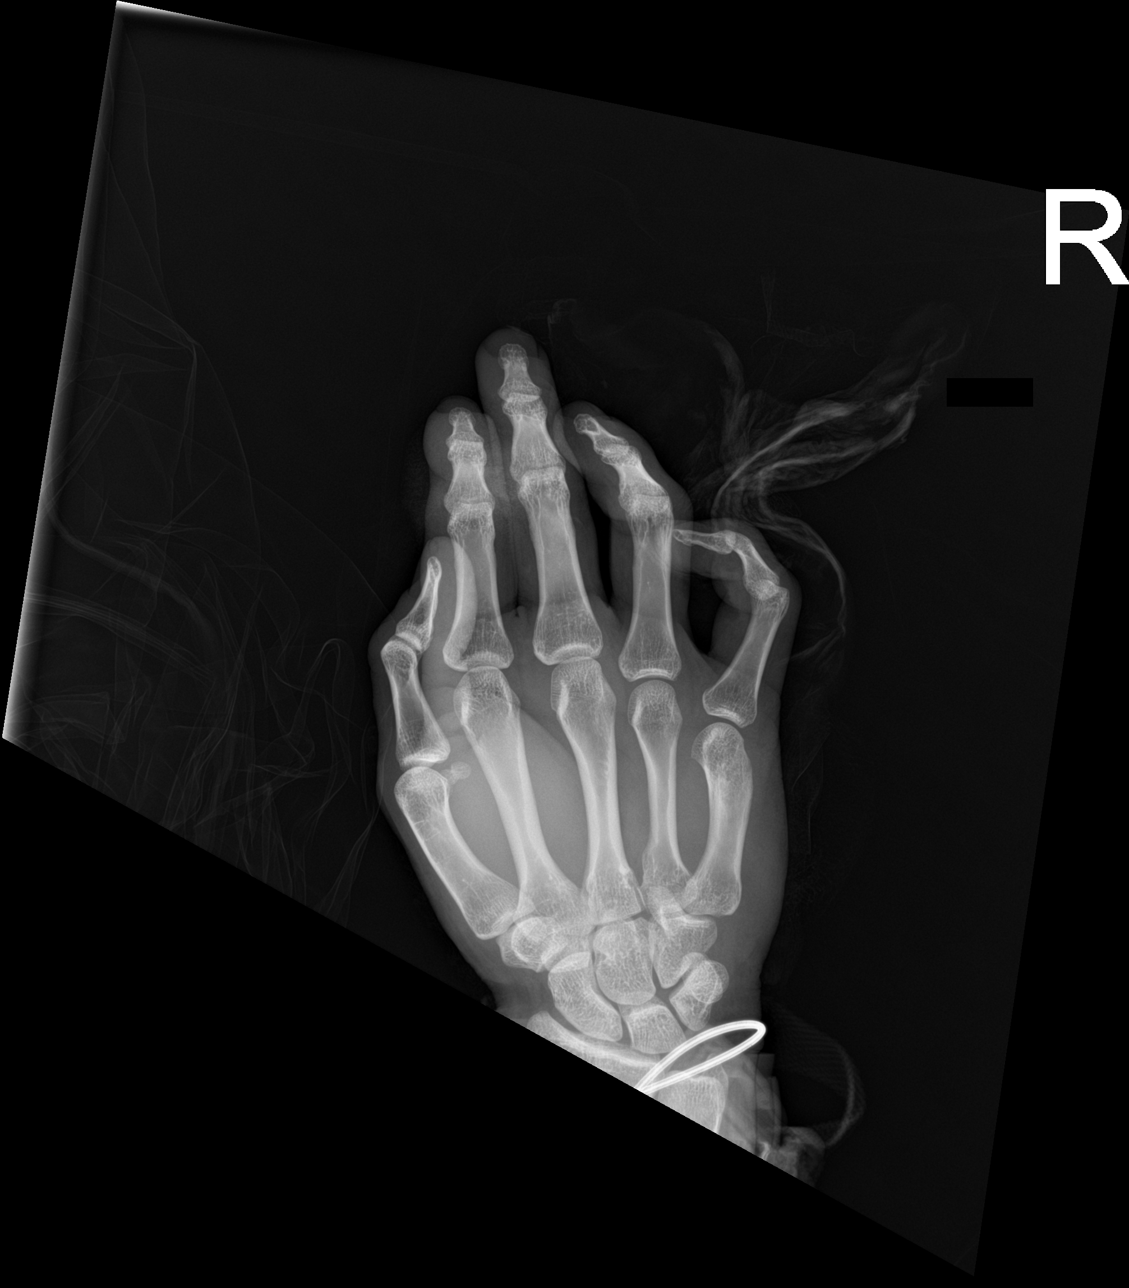
[im 2/3]
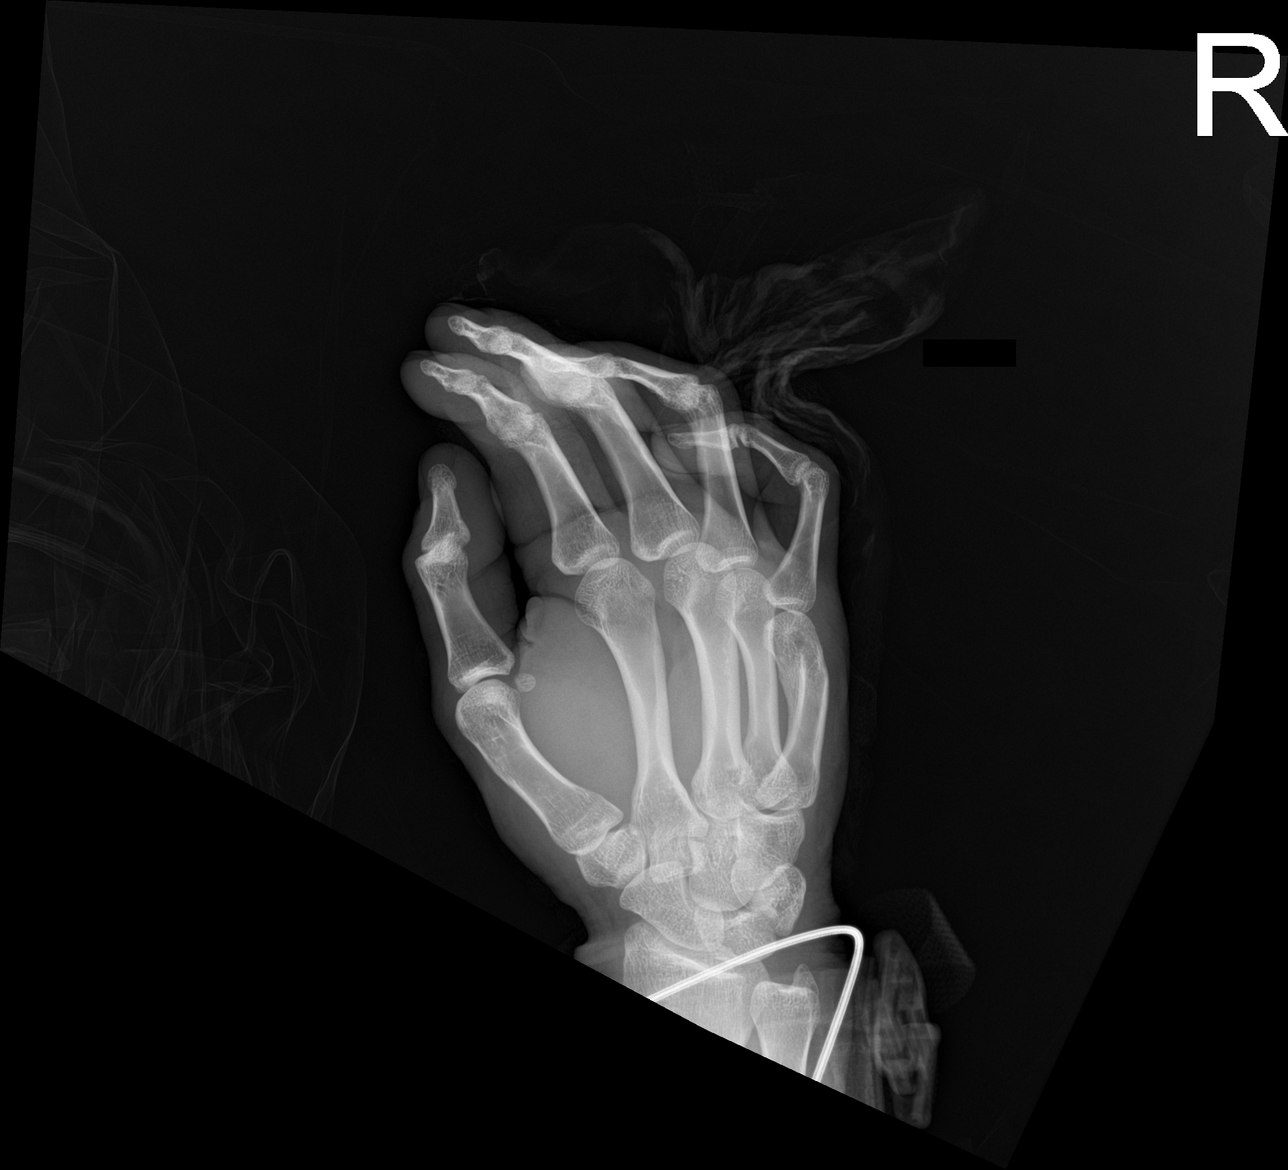
[im 3/3]
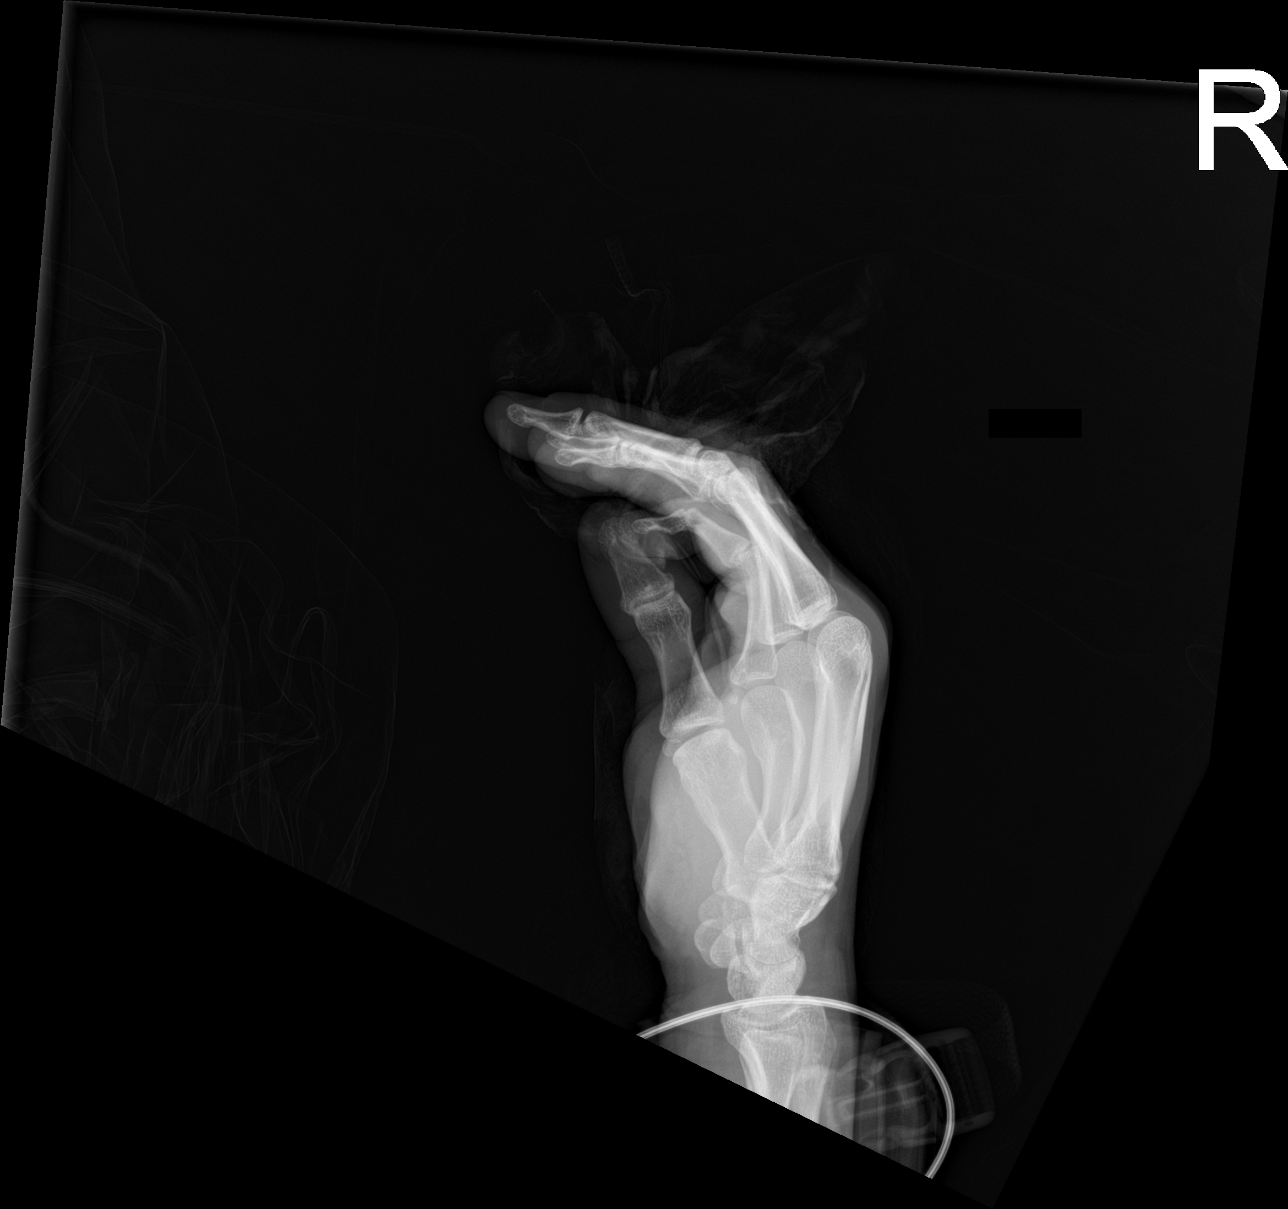

[3 of 3 positions shown; findings below may reference images not displayed]

FINDINGS: There is no evidence of fracture or dislocation. There is no
evidence of arthropathy or other focal bone abnormality. Soft
tissues are unremarkable. No radiopaque foreign body.
IMPRESSION: Negative.
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
